# Patient Record
Sex: Female | Born: 1960 | Race: White | Hispanic: No | Marital: Married
Health system: Southern US, Community
[De-identification: ages and names within clinical notes are randomized; demographics above are authoritative.]

## PROBLEM LIST (undated history)

## (undated) HISTORY — PX: ABDOMINAL HYSTERECTOMY: SHX81

---

## 2005-05-02 ENCOUNTER — Ambulatory Visit: Payer: Self-pay | Admitting: Unknown Physician Specialty

## 2005-12-31 ENCOUNTER — Other Ambulatory Visit: Payer: Self-pay

## 2006-01-03 ENCOUNTER — Ambulatory Visit: Payer: Self-pay | Admitting: Unknown Physician Specialty

## 2006-01-06 ENCOUNTER — Inpatient Hospital Stay: Payer: Self-pay | Admitting: Internal Medicine

## 2006-09-24 ENCOUNTER — Ambulatory Visit: Payer: Self-pay | Admitting: Unknown Physician Specialty

## 2008-01-26 ENCOUNTER — Ambulatory Visit: Payer: Self-pay | Admitting: Unknown Physician Specialty

## 2009-01-27 ENCOUNTER — Ambulatory Visit: Payer: Self-pay | Admitting: Unknown Physician Specialty

## 2010-02-13 ENCOUNTER — Ambulatory Visit: Payer: Self-pay | Admitting: Unknown Physician Specialty

## 2011-04-11 ENCOUNTER — Ambulatory Visit: Payer: Self-pay

## 2012-07-31 ENCOUNTER — Ambulatory Visit: Payer: Self-pay

## 2013-08-05 ENCOUNTER — Ambulatory Visit: Payer: Self-pay | Admitting: Internal Medicine

## 2013-08-14 ENCOUNTER — Ambulatory Visit: Payer: Self-pay | Admitting: Internal Medicine

## 2014-10-26 ENCOUNTER — Ambulatory Visit
Admit: 2014-10-26 | Disposition: A | Discharge status: Home / Self Care | Payer: Self-pay | Attending: Internal Medicine | Admitting: Internal Medicine

## 2015-12-06 ENCOUNTER — Encounter: Payer: Self-pay | Admitting: Medical Oncology

## 2015-12-06 ENCOUNTER — Emergency Department
Admission: EM | Admit: 2015-12-06 | Discharge: 2015-12-06 | Disposition: A | Discharge status: Home / Self Care | Payer: BC Managed Care – PPO | Attending: Emergency Medicine | Admitting: Emergency Medicine

## 2015-12-06 ENCOUNTER — Emergency Department: Payer: BC Managed Care – PPO

## 2015-12-06 DIAGNOSIS — M542 Cervicalgia: Secondary | ICD-10-CM | POA: Diagnosis present

## 2015-12-06 DIAGNOSIS — Y999 Unspecified external cause status: Secondary | ICD-10-CM | POA: Diagnosis not present

## 2015-12-06 DIAGNOSIS — R51 Headache: Secondary | ICD-10-CM | POA: Diagnosis not present

## 2015-12-06 DIAGNOSIS — Y9389 Activity, other specified: Secondary | ICD-10-CM | POA: Insufficient documentation

## 2015-12-06 DIAGNOSIS — Y92410 Unspecified street and highway as the place of occurrence of the external cause: Secondary | ICD-10-CM | POA: Diagnosis not present

## 2015-12-06 DIAGNOSIS — S161XXA Strain of muscle, fascia and tendon at neck level, initial encounter: Secondary | ICD-10-CM | POA: Insufficient documentation

## 2015-12-06 MED ORDER — IBUPROFEN 400 MG PO TABS
600.0000 mg | ORAL_TABLET | Freq: Once | ORAL | Status: AC
  Administered 2015-12-06: 600 mg via ORAL

## 2015-12-06 NOTE — Discharge Instructions (Signed)
Cervical Sprain A cervical sprain is when the tissues (ligaments) that hold the neck bones in place stretch or tear. HOME CARE   Put ice on the injured area.  Put ice in a plastic bag.  Place a towel between your skin and the bag.  Leave the ice on for 15-20 minutes, 3-4 times a day.  You may have been given a collar to wear. This collar keeps your neck from moving while you heal.  Do not take the collar off unless told by your doctor.  If you have long hair, keep it outside of the collar.  Ask your doctor before changing the position of your collar. You may need to change its position over time to make it more comfortable.  If you are allowed to take off the collar for cleaning or bathing, follow your doctor's instructions on how to do it safely.  Keep your collar clean by wiping it with mild soap and water. Dry it completely. If the collar has removable pads, remove them every 1-2 days to hand wash them with soap and water. Allow them to air dry. They should be dry before you wear them in the collar.  Do not drive while wearing the collar.  Only take medicine as told by your doctor.  Keep all doctor visits as told.  Keep all physical therapy visits as told.  Adjust your work station so that you have good posture while you work.  Avoid positions and activities that make your problems worse.  Warm up and stretch before being active. GET HELP IF:  Your pain is not controlled with medicine.  You cannot take less pain medicine over time as planned.  Your activity level does not improve as expected. GET HELP RIGHT AWAY IF:   You are bleeding.  Your stomach is upset.  You have an allergic reaction to your medicine.  You develop new problems that you cannot explain.  You lose feeling (become numb) or you cannot move any part of your body (paralysis).  You have tingling or weakness in any part of your body.  Your symptoms get worse. Symptoms include:  Pain,  soreness, stiffness, puffiness (swelling), or a burning feeling in your neck.  Pain when your neck is touched.  Shoulder or upper back pain.  Limited ability to move your neck.  Headache.  Dizziness.  Your hands or arms feel week, lose feeling, or tingle.  Muscle spasms.  Difficulty swallowing or chewing. MAKE SURE YOU:   Understand these instructions.  Will watch your condition.  Will get help right away if you are not doing well or get worse.   This information is not intended to replace advice given to you by your health care provider. Make sure you discuss any questions you have with your health care provider.   Document Released: 12/12/2007 Document Revised: 02/25/2013 Document Reviewed: 12/31/2012 Elsevier Interactive Patient Education [employer]2016 [employer]  Please return immediately if condition worsens. Please contact her primary physician or the physician you were given for referral. If you have any specialist physicians involved in her treatment and plan please also contact them. Thank you for using [facility].
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
## 2017-06-10 ENCOUNTER — Other Ambulatory Visit: Payer: Self-pay | Admitting: Internal Medicine

## 2017-06-10 DIAGNOSIS — Z1231 Encounter for screening mammogram for malignant neoplasm of breast: Secondary | ICD-10-CM

## 2017-06-26 ENCOUNTER — Ambulatory Visit
Admission: RE | Admit: 2017-06-26 | Discharge: 2017-06-26 | Disposition: A | Discharge status: Home / Self Care | Payer: BC Managed Care – PPO | Source: Ambulatory Visit | Attending: Internal Medicine | Admitting: Internal Medicine

## 2017-06-26 DIAGNOSIS — Z1231 Encounter for screening mammogram for malignant neoplasm of breast: Secondary | ICD-10-CM | POA: Diagnosis not present

## 2018-07-18 ENCOUNTER — Other Ambulatory Visit: Payer: Self-pay | Admitting: Internal Medicine

## 2018-07-18 DIAGNOSIS — Z1231 Encounter for screening mammogram for malignant neoplasm of breast: Secondary | ICD-10-CM

## 2018-07-30 ENCOUNTER — Ambulatory Visit
Admission: RE | Admit: 2018-07-30 | Discharge: 2018-07-30 | Disposition: A | Discharge status: Home / Self Care | Payer: BC Managed Care – PPO | Source: Ambulatory Visit | Attending: Internal Medicine | Admitting: Internal Medicine

## 2018-07-30 ENCOUNTER — Encounter (INDEPENDENT_AMBULATORY_CARE_PROVIDER_SITE_OTHER): Payer: Self-pay

## 2018-07-30 DIAGNOSIS — Z1231 Encounter for screening mammogram for malignant neoplasm of breast: Secondary | ICD-10-CM | POA: Insufficient documentation

## 2020-02-05 ENCOUNTER — Other Ambulatory Visit: Payer: Self-pay | Admitting: Internal Medicine

## 2020-02-05 DIAGNOSIS — Z1231 Encounter for screening mammogram for malignant neoplasm of breast: Secondary | ICD-10-CM

## 2020-02-18 ENCOUNTER — Other Ambulatory Visit: Payer: Self-pay

## 2020-02-18 ENCOUNTER — Encounter (INDEPENDENT_AMBULATORY_CARE_PROVIDER_SITE_OTHER): Payer: Self-pay

## 2020-02-18 ENCOUNTER — Ambulatory Visit
Admission: RE | Admit: 2020-02-18 | Discharge: 2020-02-18 | Disposition: A | Discharge status: Home / Self Care | Payer: BC Managed Care – PPO | Source: Ambulatory Visit | Attending: Internal Medicine | Admitting: Internal Medicine

## 2020-02-18 DIAGNOSIS — Z1231 Encounter for screening mammogram for malignant neoplasm of breast: Secondary | ICD-10-CM | POA: Insufficient documentation

## 2021-03-08 ENCOUNTER — Other Ambulatory Visit: Payer: Self-pay | Admitting: Internal Medicine

## 2021-03-08 DIAGNOSIS — Z1231 Encounter for screening mammogram for malignant neoplasm of breast: Secondary | ICD-10-CM

## 2021-04-05 ENCOUNTER — Other Ambulatory Visit: Payer: Self-pay

## 2021-04-05 ENCOUNTER — Ambulatory Visit
Admission: RE | Admit: 2021-04-05 | Discharge: 2021-04-05 | Disposition: A | Discharge status: Home / Self Care | Payer: BC Managed Care – PPO | Source: Ambulatory Visit | Attending: Internal Medicine | Admitting: Internal Medicine

## 2021-04-05 DIAGNOSIS — Z1231 Encounter for screening mammogram for malignant neoplasm of breast: Secondary | ICD-10-CM | POA: Insufficient documentation

## 2022-03-14 ENCOUNTER — Other Ambulatory Visit: Payer: Self-pay | Admitting: Internal Medicine

## 2022-03-14 DIAGNOSIS — Z1231 Encounter for screening mammogram for malignant neoplasm of breast: Secondary | ICD-10-CM

## 2022-04-09 ENCOUNTER — Ambulatory Visit
Admission: RE | Admit: 2022-04-09 | Discharge: 2022-04-09 | Disposition: A | Discharge status: Home / Self Care | Payer: BC Managed Care – PPO | Source: Ambulatory Visit | Attending: Internal Medicine | Admitting: Internal Medicine

## 2022-04-09 DIAGNOSIS — Z1231 Encounter for screening mammogram for malignant neoplasm of breast: Secondary | ICD-10-CM | POA: Diagnosis present

## 2023-02-07 IMAGING — MG MM DIGITAL SCREENING BILAT W/ TOMO AND CAD
8 series · 8 of 24 positions shown · non-contrast
Comparison: Previous exam(s).

CLINICAL DATA: Screening.

EXAM:
DIGITAL SCREENING BILATERAL MAMMOGRAM WITH TOMOSYNTHESIS AND CAD
TECHNIQUE: Bilateral screening digital craniocaudal and mediolateral oblique
mammograms were obtained. Bilateral screening digital breast
tomosynthesis was performed. The images were evaluated with
computer-aided detection.

[L CC synth-2D]
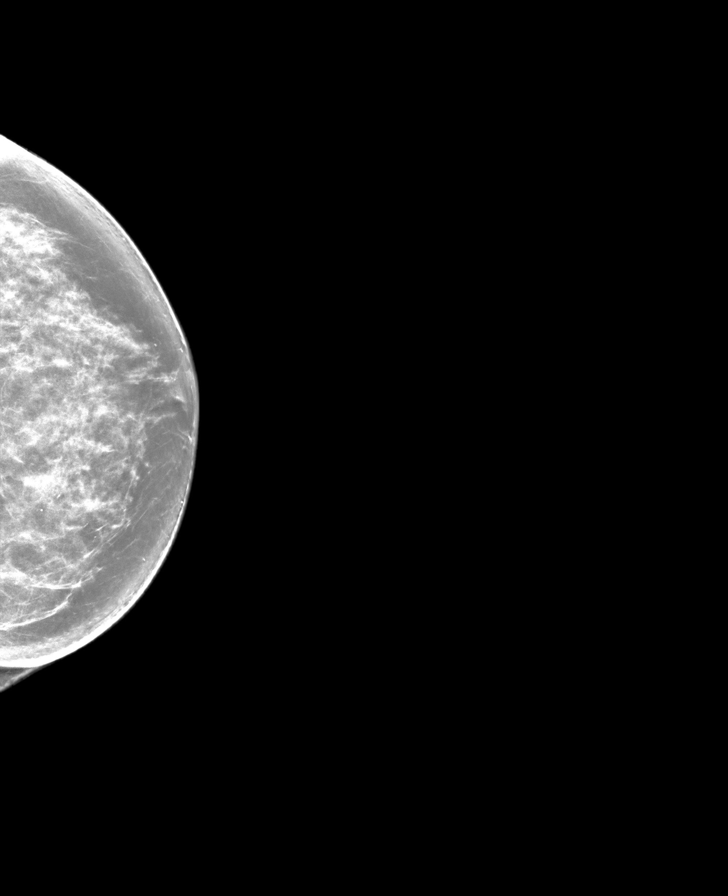

[L MLO synth-2D]
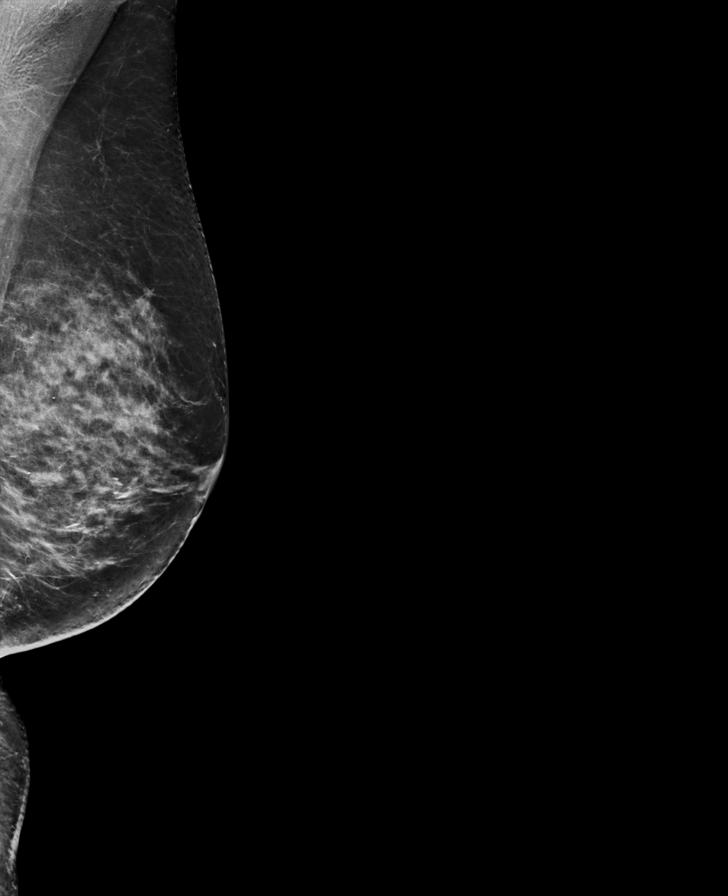

[R MLO synth-2D]
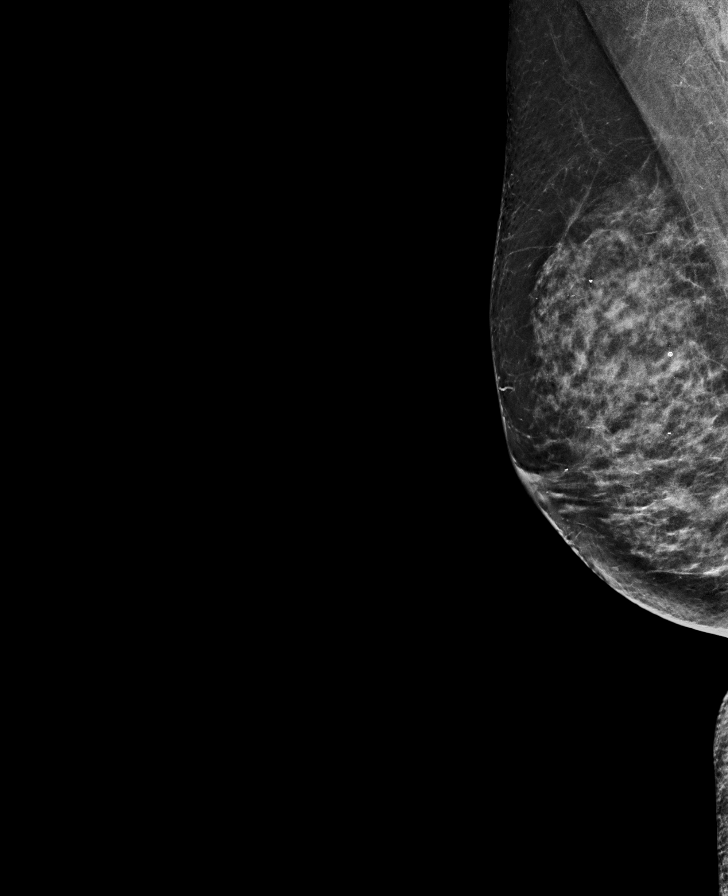

[R CC synth-2D]
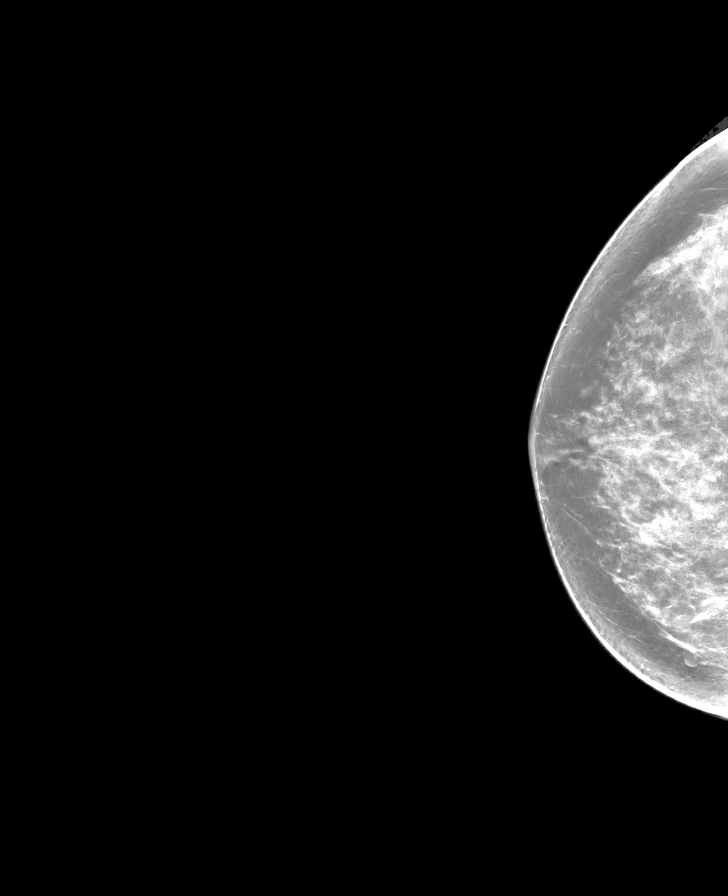

[R CC tomo · tomo slice 43/84.0]
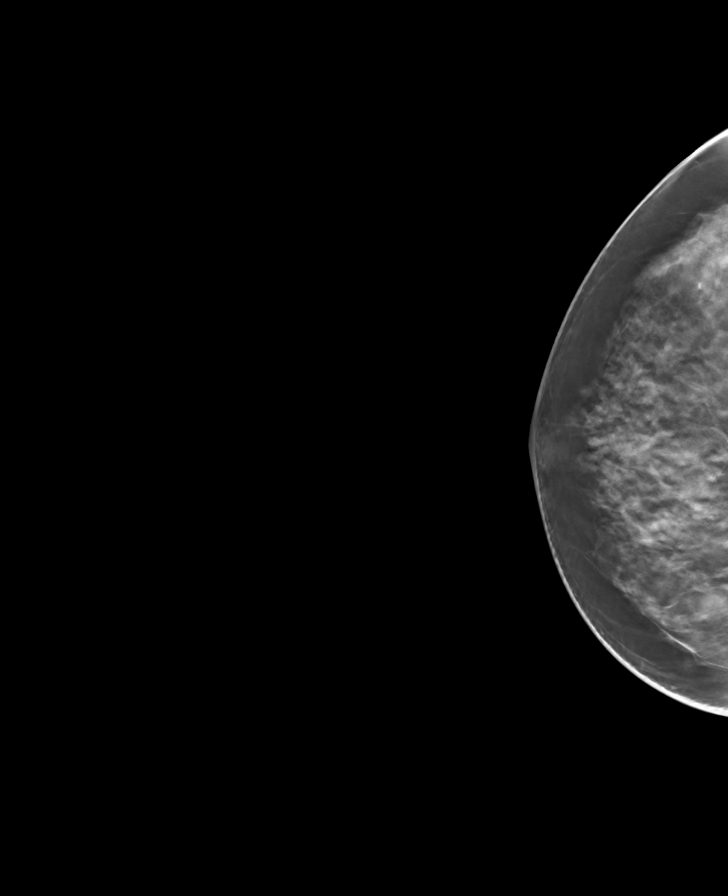

[L CC tomo · tomo slice 41/80.0]
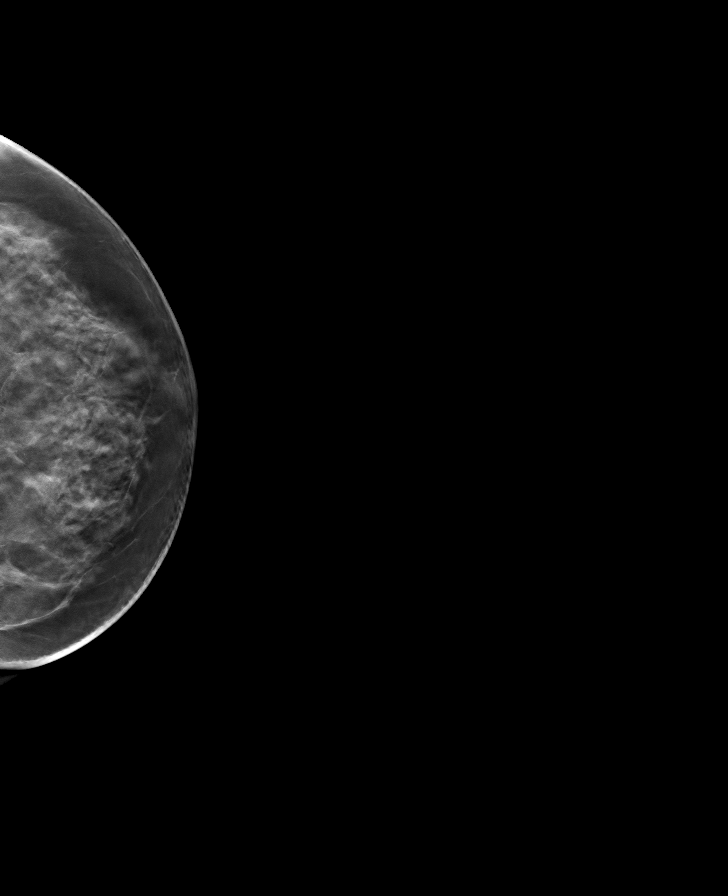

[R MLO tomo · tomo slice 35/70.0]
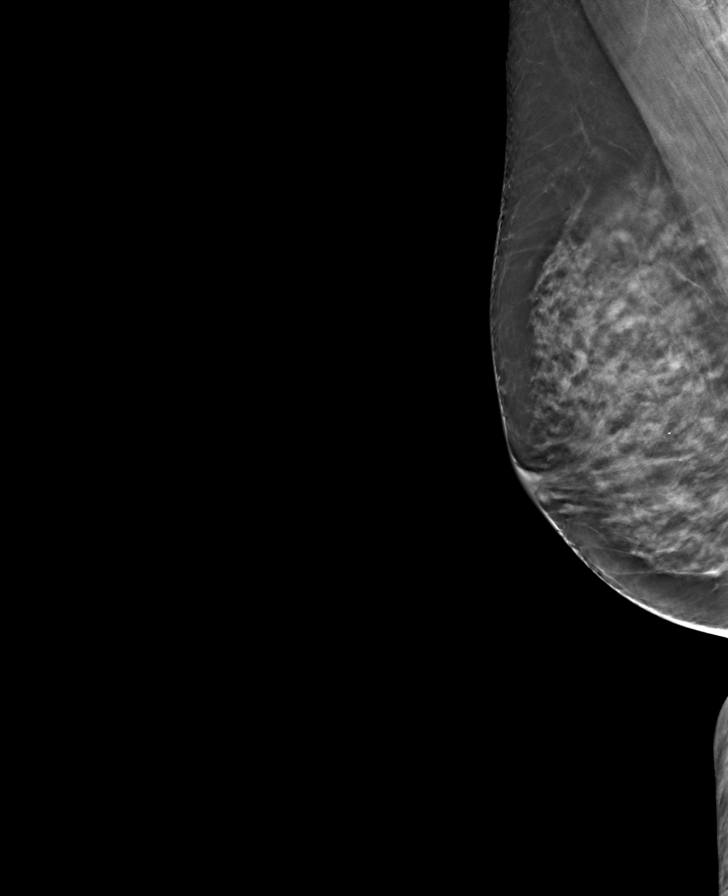

[L MLO tomo · tomo slice 39/76.0]
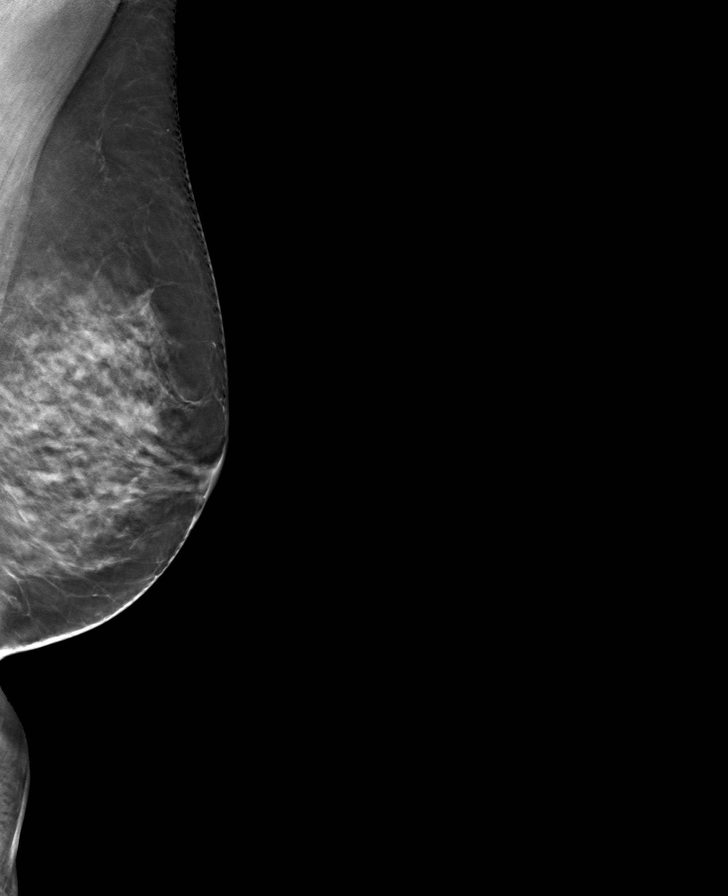

[8 of 24 positions shown; findings below may reference images not displayed]

ACR Breast Density Category c: The breast tissue is heterogeneously
dense, which may obscure small masses.
FINDINGS: There are no findings suspicious for malignancy.
IMPRESSION: No mammographic evidence of malignancy. A result letter of this
screening mammogram will be mailed directly to the patient.

RECOMMENDATION:
Screening mammogram in one year. (Code:Q3-W-BC3)

BI-RADS CATEGORY  1: Negative.

## 2023-06-10 ENCOUNTER — Other Ambulatory Visit: Payer: Self-pay | Admitting: Internal Medicine

## 2023-06-10 DIAGNOSIS — Z1231 Encounter for screening mammogram for malignant neoplasm of breast: Secondary | ICD-10-CM

## 2023-07-15 ENCOUNTER — Inpatient Hospital Stay: Admission: RE | Admit: 2023-07-15 | Payer: BC Managed Care – PPO | Source: Ambulatory Visit

## 2023-07-15 ENCOUNTER — Ambulatory Visit
Admission: RE | Admit: 2023-07-15 | Discharge: 2023-07-15 | Disposition: A | Discharge status: Home / Self Care | Payer: 59 | Source: Ambulatory Visit | Attending: Internal Medicine | Admitting: Internal Medicine

## 2023-07-15 DIAGNOSIS — Z1231 Encounter for screening mammogram for malignant neoplasm of breast: Secondary | ICD-10-CM | POA: Diagnosis present

## 2023-07-16 ENCOUNTER — Encounter: Payer: Self-pay | Admitting: Internal Medicine

## 2023-07-23 ENCOUNTER — Encounter: Payer: Self-pay | Admitting: Internal Medicine

## 2023-08-08 ENCOUNTER — Other Ambulatory Visit: Payer: Self-pay | Admitting: Internal Medicine

## 2023-08-08 DIAGNOSIS — R928 Other abnormal and inconclusive findings on diagnostic imaging of breast: Secondary | ICD-10-CM

## 2023-08-13 ENCOUNTER — Ambulatory Visit
Admission: RE | Admit: 2023-08-13 | Discharge: 2023-08-13 | Disposition: A | Discharge status: Home / Self Care | Payer: 59 | Source: Ambulatory Visit | Attending: Internal Medicine | Admitting: Internal Medicine

## 2023-08-13 DIAGNOSIS — R928 Other abnormal and inconclusive findings on diagnostic imaging of breast: Secondary | ICD-10-CM
